# Patient Record
Sex: Male | Born: 1975 | Race: White | Hispanic: No | Marital: Single | Smoking: Former smoker
Health system: Southern US, Community
[De-identification: ages and names within clinical notes are randomized; demographics above are authoritative.]

## PROBLEM LIST (undated history)

## (undated) DIAGNOSIS — F1990 Other psychoactive substance use, unspecified, uncomplicated: Secondary | ICD-10-CM

---

## 2003-12-14 ENCOUNTER — Emergency Department (HOSPITAL_COMMUNITY): Admission: EM | Admit: 2003-12-14 | Discharge: 2003-12-14 | Payer: Self-pay | Admitting: Emergency Medicine

## 2005-05-07 ENCOUNTER — Emergency Department: Payer: Self-pay | Admitting: Emergency Medicine

## 2006-07-16 ENCOUNTER — Inpatient Hospital Stay (HOSPITAL_COMMUNITY): Admission: AD | Admit: 2006-07-16 | Discharge: 2006-07-18 | Payer: Self-pay | Admitting: Psychiatry

## 2006-07-16 ENCOUNTER — Ambulatory Visit: Payer: Self-pay | Admitting: Psychiatry

## 2007-02-03 ENCOUNTER — Emergency Department (HOSPITAL_COMMUNITY): Admission: EM | Admit: 2007-02-03 | Discharge: 2007-02-03 | Payer: Self-pay | Admitting: Emergency Medicine

## 2007-06-24 ENCOUNTER — Ambulatory Visit: Payer: Self-pay | Admitting: Psychiatry

## 2007-06-24 ENCOUNTER — Inpatient Hospital Stay (HOSPITAL_COMMUNITY): Admission: RE | Admit: 2007-06-24 | Discharge: 2007-06-27 | Payer: Self-pay | Admitting: Psychiatry

## 2007-06-24 ENCOUNTER — Emergency Department (HOSPITAL_COMMUNITY): Admission: EM | Admit: 2007-06-24 | Discharge: 2007-06-24 | Payer: Self-pay | Admitting: Emergency Medicine

## 2007-08-19 ENCOUNTER — Emergency Department (HOSPITAL_COMMUNITY): Admission: EM | Admit: 2007-08-19 | Discharge: 2007-08-19 | Payer: Self-pay | Admitting: Emergency Medicine

## 2008-06-23 IMAGING — CR DG CHEST 2V
2 series · 2 of 2 positions shown · non-contrast
Comparison: None.

CLINICAL DATA: Short of breath/fever/cough.
 CHEST - 2 VIEW:

[w chest pa]
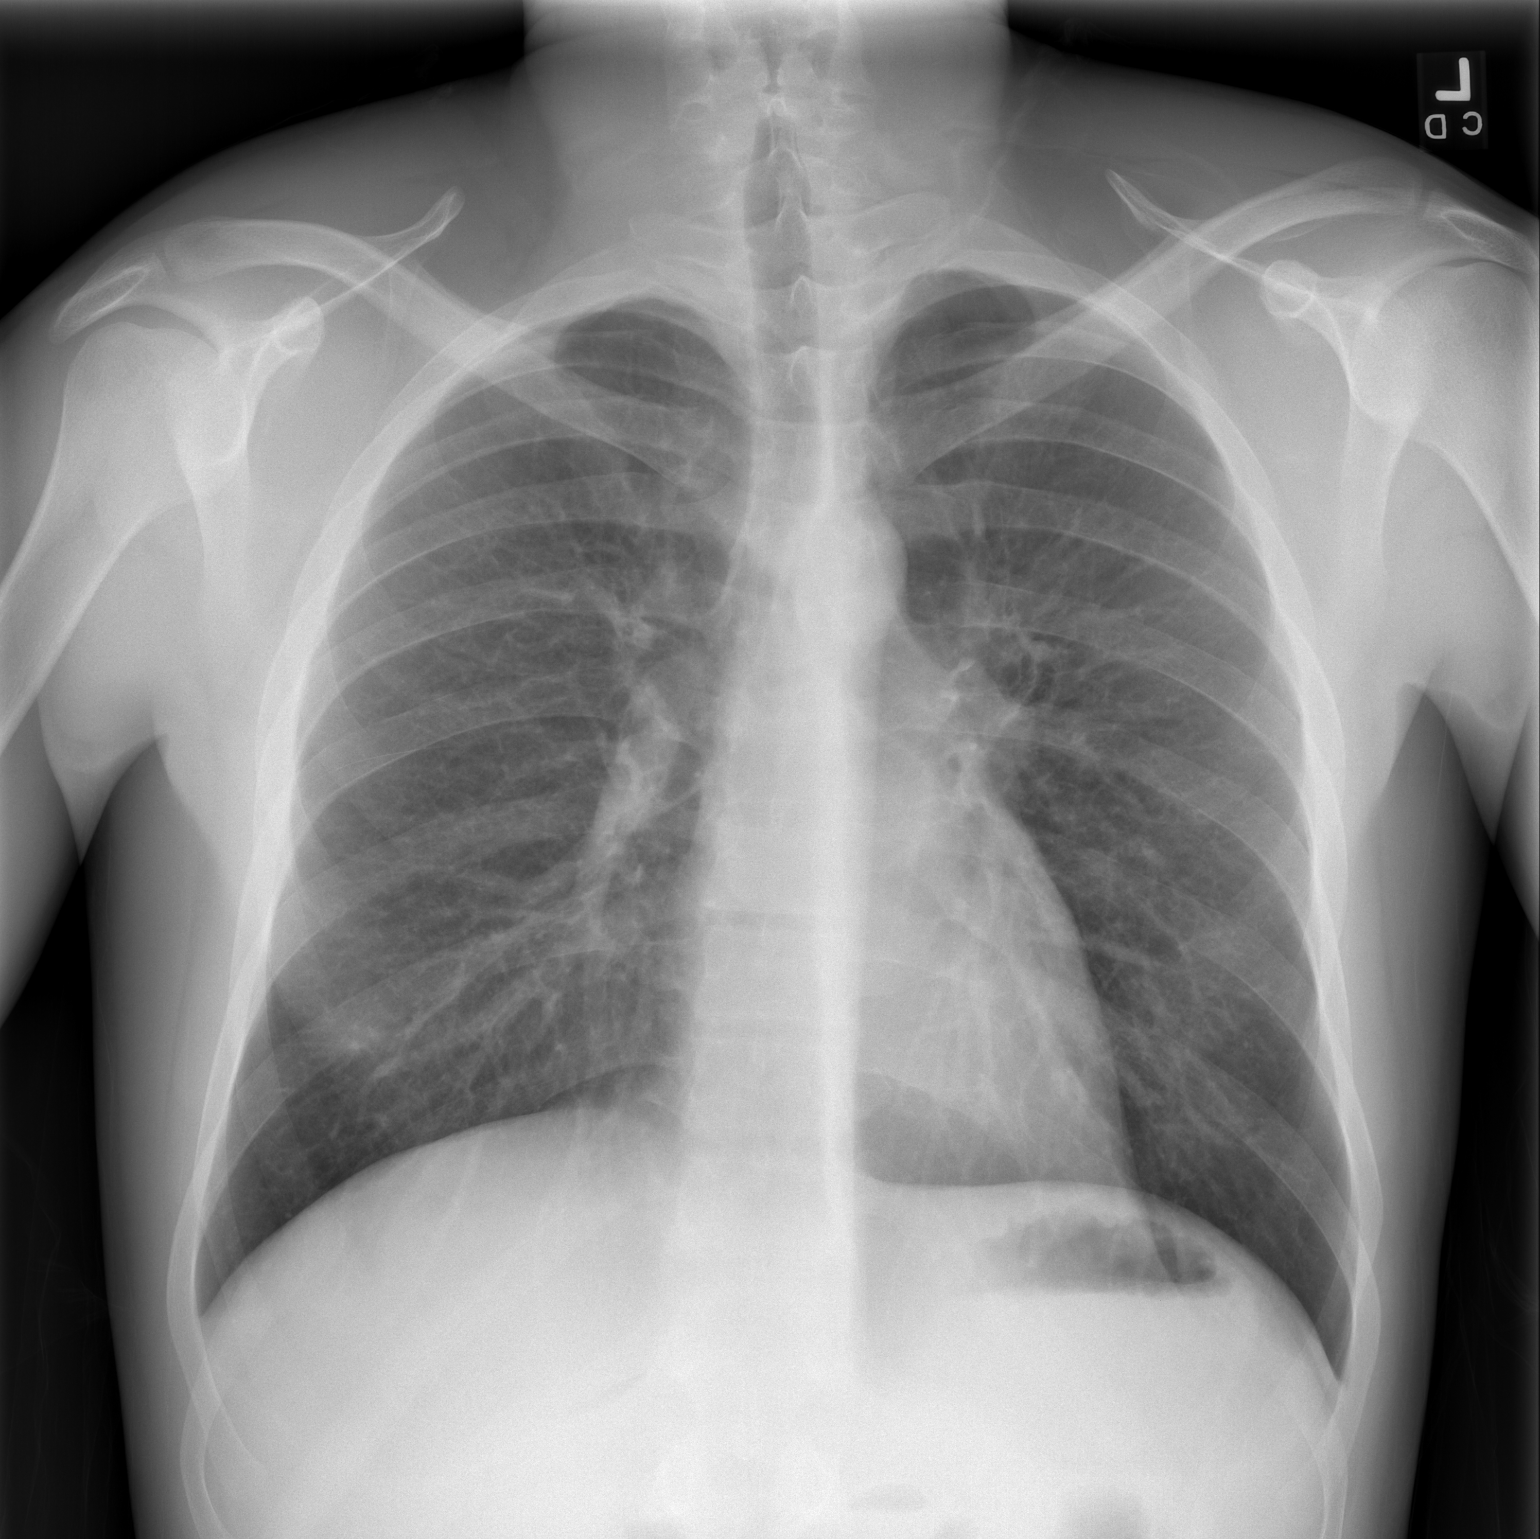

[w chest lat]
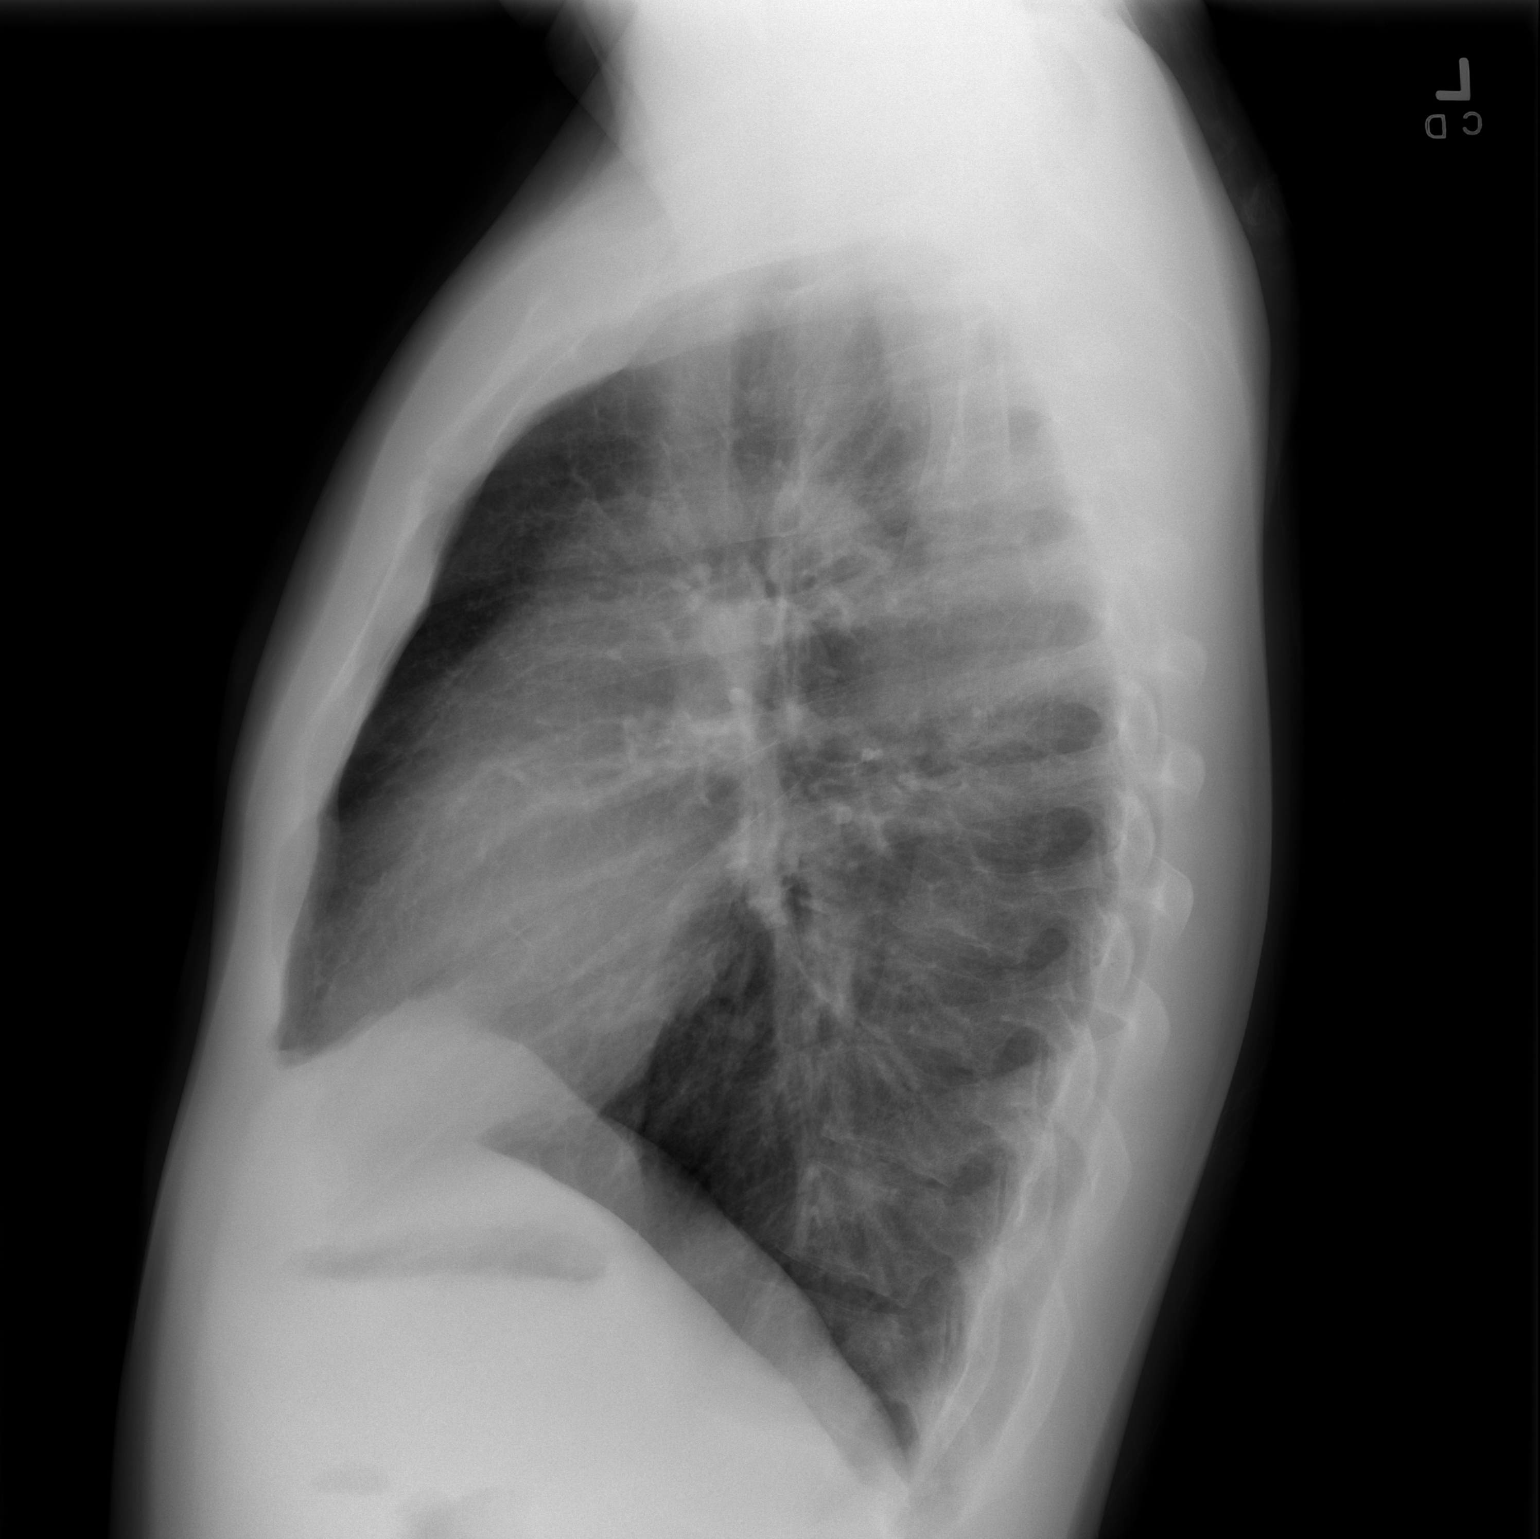

[2 of 2 positions shown; findings below may reference images not displayed]

FINDINGS: Heart and mediastinal contours normal.  Moderate peribronchial thickening.  No definite airspace disease but there is a possible subtle density in the posterior aspect of the right lower lobe.  Cannot exclude early right lower lobe pneumonia.  This is not a definite finding.  Careful clinical correlation needed.
IMPRESSION: 1.  Peribronchial thickening.
 2.  Cannot rule out subtle patchy airspace infiltrate in the right lower lobe ? see report.

## 2010-12-22 NOTE — Discharge Summary (Signed)
NAME:  Shaun Gilbert, Shaun Gilbert NO.:  [id number]   MEDICAL RECORD NO.:  [id number]          PATIENT TYPE:  IPS   LOCATION:  0503                          FACILITY:  [facility]   PHYSICIAN:  Geoffery Lyons, M.D.      DATE OF BIRTH:  1975-09-09   DATE OF ADMISSION:  06/24/2007  DATE OF DISCHARGE:  06/27/2007                               DISCHARGE SUMMARY   CHIEF COMPLAINT:  This was the second admission to [facility] for this 35 year old white male voluntarily admitted.  Presented  after an overdose of Darvocet after arguing with his spouse.  Also  requests detox from heroin using IV heroin for last 31-month and before  that sporadic history of opiate abuse involving other use of the use of  various forms.  The mother was an opiate abuser and he got started with  her 7 to 10 bags day before.  Endorsed alcohol 2 to 3 times a month,  endorsed cocaine abuse 2 times a week for 16 years.  Last use the day  before.   PAST PSYCHIATRIC HISTORY:  [facility] time at [facility] and was in  [facility] in 2000 after cutting his wrists.  History as  already stated persistent use of substances more so as of gradually  getting dependent on heroin.   MEDICAL HISTORY:  Noncontributory.   MEDICATIONS:  None prescribed.   PHYSICAL EXAMINATION:  Physical exam performed; failed to show any acute  findings.   LABORATORY WORK:  SGOT 83, SGPT 118, total bilirubin 0.8, TSH 0.304.  He  is positive for cocaine and opiates.  Blood chemistry within normal  limits.   MENTAL STATUS EXAMINATION:  Mental status evaluation reveals fully alert  male somewhat anxious, poor eye contact reclusive to room.  Poor  historian.  Speech normal rate, tempo and production.  Mood depressed,  anxious, affect anxious to constricted.  Thought processes logical,  coherent and relevant; endorses ruminations,  no active plan.  No  homicidal ideas, no delusions.  No hallucinations.  Cognition well-  preserved.   DIAGNOSES:  AXIS I:  Opiate and cocaine abuse rule out dependence.  Depressive disorder not otherwise specified.  AXIS II:  No diagnosis.  AXIS III:  Phlebitis.  AXIS IV:  Moderate.  AXIS V:  On admission 35 global assessment of functioning, GAF in the  last year 60.   COURSE IN THE HOSPITAL:  Was admitted.  He was started in individual and  group psychotherapy.  Was detoxified with clonidine.  He was given  trazodone for sleep.  As already stated, he is a 35 year old man,  endorsed present use of opiates heroin.  Endorsed active withdrawal  cramping really, bad malaise, muscle aches, in bed as not to be made to  get out of bed, acutely ill, not able to give very reliable history.  By  the 20th he was somewhat better.  Endorsed that he went to a halfway  house, that he was sent to when he was here before and that a guy that  he knew from before was trying to  hurt him and accusing him of something  he did not do. When the police came with a warrant he was asked to leave  the halfway house.  He said this set the stage according to him for  relapse for his relapse.  Also endorsed difficulty after breaking up  with the girlfriend's mother of his daughter and her moving away.  [place] Spaniel  that his daughter he thinks is with her grandparents and suspects that  his ex-girlfriend left to go to the [employer].  Endorsed he was trying to  get his life back together, wants detox.  He was wanting to go back into  halfway house or some other program as of lately trying to get a day  job, very upset because his not the way he was wanting his life to be.  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  By the 21st he was better, was able to secure  a placement at a halfway house.  He was encouraged, motivated to focus,  feeling physically better.  He was going to contact the daughter,  daughter's grandparents and tell them that he was really  working toward  getting his life back together and will ask them to be able to contact  with his daughter.   Upon discharge in full contact with reality. No suicidal ideas, no  hallucinations or delusions.   DISCHARGE DIAGNOSES:  AXIS I:  Depressive disorder not otherwise  specified.  Opiate, cocaine abuse.  AXIS II:  No diagnosis.  AXIS IV:  Moderate.  AXIS V:  Upon discharge 50, 55.   DISCHARGE PLAN:  Discharged on clonidine taper 0.1 1 in the morning 1 at  night for 2 days and then 1 in the morning for 2 days, then discontinue.  Trazodone 100 1/2 to 1 at bedtime as needed for sleep.      Geoffery Lyons, M.D.  Electronically Signed     IL/MEDQ  D:  08/01/2007  T:  08/02/2007  Job:  [id number]

## 2010-12-22 NOTE — Discharge Summary (Signed)
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  His  fiancee had broke up with him over his addiction.  They had a young  child together.  He had no prior inpatient treatment history, but had  kicked cocaine once before on his own for a period of 6 months.  Prior  to admission, he took an overdose of ibuprofen.  There was a family  history of suicide in his grandfather.  Please refer to the admission  note for further details pertaining to the symptoms, circumstances, and  history that led to his hospitalization.  He was given an initial Axis-I  diagnosis of cocaine dependence, depressive disorder NOS, and substance-  induced mood disorder.   MEDICAL AND LABORATORY:  The patient was medically and physically  assessed by the psychiatric nurse practitioner.  He was in good health  without any active or chronic medical problems.  There were no  significant medical issues.   HOSPITAL COURSE:  The patient was admitted to the adult inpatient  psychiatric service.  He presented as a well-nourished, well-developed  male who was fully oriented, and presented as tired, and moderately  depressed.  He was nontremulous.  He denied suicidal ideation.  There  were no signs or symptoms of psychosis, thought disorder, or delirium.  He verbalized a strong desire for help and to get into recovery and  maintain his sobriety.   Initially, a Librium detoxification protocol had been ordered, but  it  was deemed unnecessary and discontinued.  He participated in various  therapeutic groups and activities including groups oriented towards 12-  step recovery.  On the third hospital day, the patient indicated that he  felt quite ready for discharge.  He was not having any significant  withdrawal symptoms.  He agreed to the following aftercare plan.   AFTERCARE:  The patient was to leave our inpatient facility and go  directly to an [facility] at 5:00 p.m. on the day of discharge,  regarding a residential halfway house situation for sober man.   DISCHARGE MEDICATIONS:  None.   The patient was encouraged to attend AA meetings frequently and get an  AA sponsor.   DISCHARGE DIAGNOSES:  Axis I:  Alcohol dependence, cocaine dependence,  early remission.  Axis II:  Deferred.  Axis III:  No acute or chronic illnesses.  Axis IV:  Stressors severe.  Axis V: Global assessment of functioning on discharge 65.      Anselm Jungling, MD  Electronically Signed     SPB/MEDQ  D:  08/15/2006  T:  08/16/2006  Job:  [id number] 

## 2010-12-22 NOTE — H&P (Signed)
NAME:  Shaun Gilbert, Shaun Gilbert NO.:  [id number]   MEDICAL RECORD NO.:  [id number]          PATIENT TYPE:  IPS   LOCATION:  0300                          FACILITY:  [facility]   PHYSICIAN:  Anselm Jungling, MD  DATE OF BIRTH:  11/30/1975   DATE OF ADMISSION:  07/16/2006  DATE OF DISCHARGE:                       PSYCHIATRIC ADMISSION ASSESSMENT   IDENTIFICATION:  This is a 35 year old white male who is single.  This  is a voluntary admission.   HISTORY OF PRESENT ILLNESS:  This patient overdosed on ibuprofen, taking  several tablets, while under the influence of about $140 worth of  cocaine which he had snorted and smoked in the 24 hours prior to  admission.  He also had drunk some alcohol and had an alcohol level of  about 35.  He is the new father of a 84-week-old infant daughter and had  been housed with his girlfriend at the girlfriend's mother's home.  They  got into an argument, and he left the home.  Subsequently, his  girlfriend was also kicked out of the home.  He attempted to obtain  housing in a motel for her, but they were running low on funds.  The  patient reports he relapsed then on cocaine triggered by his feeling of  failure as a father and Willies Laviolette to his new family.  The patient reports  that prior to that he had been totally abstinent from drugs and alcohol  for about six months, has a history of using of cocaine since age 64  which is his primary drug of choice.  He used a benzodiazepine tablet,  one, within the last 48 hours to help himself calm down, having a lot of  guilt about his failure and his relapse.  He clearly had suicidal  thoughts at the time he overdosed.  He denies any suicidal thoughts  today, continues to feel guilty and worthless.   PAST PSYCHIATRIC HISTORY:  This is the patient's first admission to  [facility].  He has no current outpatient  psychiatric care.  He does have a history of prior admission to [facility] at age 60 for drug abuse and mood problems and was treated at  that time with Prozac 20 mg daily which he took for about a year or so,  and he felt that it helped him and subsequently stopped.  He also has a  history of an admission to [facility] in 2000 after a suicide  attempt by cutting his wrist after the death of his best friend.  He is  currently on no medications.  He reports a history of substance abuse  since age 67, primarily cocaine, but also has a distant history of IV  drug use of heroin, none in the last 5 years.  Rare use of alcohol.  Denies addiction to currently to any substances other than cocaine.   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  He  is currently homeless  as noted above, and he is jobless.  No current legal charges.   FAMILY HISTORY:  The patient's mother was addicted to opiates and died  of an overdose approximately six years ago.  He has no siblings.  Father's history is unknown.   ALCOHOL AND DRUG HISTORY:  The patient's primary drug of choice is  cocaine abuse.  The patient endorses that he has maintained abstinence  with the help of a busy work schedule and staying goal-directed, has  never used medicines to help cut cravings or maintain abstinence.   MEDICAL HISTORY:  The patient has no primary care Melea Prezioso.  Medical  problems are hypokalemia which was repleted in the emergency room with  20 mEq of a potassium and status post NSAID overdose, and he was  medically cleared by poison control.  No other medical problems. Past  medical history is remarkable for a history of childhood asthma, has not  had an asthma attack in more than 20 years.  He denies any history of  seizures, blackouts or memory loss.  Denies any liver disease, kidney  disease, myocardial infarct or history of chest  pain.  No prior  surgeries or hospitalizations other than psychiatric hospitalizations  noted above.   MEDICATIONS:  Are none.   DRUG ALLERGIES:  PENICILLIN WHICH CAUSES A RASH.   POSITIVE PHYSICAL FINDINGS:  This is a well-nourished, well-developed  male who appears to be his stated age of 37, slightly flushed in  appearance with an anxious affect.  VITAL SIGNS:  On presentation, he is afebrile, pulse 113, respirations  18, blood pressure 142/85.  Physical exam was done in the emergency room and is noted there and is  entirely unremarkable.  NEUROLOGICAL EXAM:  Is nonfocal.   DIAGNOSTIC STUDIES:  Reveal urine drug screen positive for cocaine and  benzodiazepines.  Alcohol level of 35.  His CBC:  WBC 11.8, hemoglobin  15.2, hematocrit 44.2 and platelets 267,000.  Chemistry:  Sodium 140,  potassium 3.8, chloride 102, carbon dioxide 29, BUN 12, creatinine 1.1  and random glucose 96, and these labs are from December 12.  TSH is  0.595.  His acetaminophen medicine level was less than 10 and salicylate  level was less than 4.  Five feet 9 inches tall, 176 pounds.   MENTAL STATUS EXAM:  Fully alert male, pleasant, cooperative, polite.  He is alert with quite a bit of tearfulness to his affect, especially  when talks about how guilty he feels.  He feels helpless to be able to  support is family.  Speech is normal in pace, tone, and amount,  articulate, fluent.  Mood is depressed, considerable guilt and feels  worthless, very guilty that he has relapsed and feeling guilty about  failure to provide as a father and spouse.  Thought process is logical  and coherent.  Denying any suicidal thoughts today but clearly had those  yesterday.  No perceptual changes.  Cognitively, he is intact and  oriented x3.  Insight is adequate.  Impulse control and judgment within normal limits.  No withdrawal symptoms.  CIWA is essentially zero.  His  pulse has normalized today after our having received 25 mg  of Librium  last night which made him very drowsy.  No homicidal thought.   AXIS I:  Substance-induced mood disorder.  Cocaine dependence.  AXIS II:  Deferred.  AXIS III:  No diagnosis.  AXIS IV:  Severe, problems with homelessness and being a new father  with  15-week-old infant.  AXIS V:  Current 30, past year 73.   PLAN:  Is to voluntarily admit the patient with every 15-minute checks  in place for evaluation of his suicide attempt and to alleviate his  suicidal thoughts.  We are going to stop the Librium protocol since it  is making him oversedated.  He has only used benzodiazepines once and  denies any dependence on those.  Rare use of alcohol.  We will give him  trazodone 25-50 mg h.s. p.r.n., and we will obtain a complete metabolic  panel to evaluate his hepatic enzymes and to recheck his renal function  after the ibuprofen overdose.  Estimated length of stay is 5 days.      Margaret A. Lorin Picket, N.P.      Anselm Jungling, MD  Electronically Signed   MAS/MEDQ  D:  07/17/2006  T:  07/17/2006  Job:  [phone]

## 2011-05-15 LAB — BASIC METABOLIC PANEL
CO2: 27
Chloride: 101
GFR calc non Af Amer: >60
Sodium: 135

## 2011-05-15 LAB — ACETAMINOPHEN LEVEL: Acetaminophen (Tylenol), Serum: <10 — ABNORMAL LOW

## 2011-05-15 LAB — URINALYSIS, ROUTINE W REFLEX MICROSCOPIC
Bilirubin Urine: NEGATIVE
Glucose, UA: NEGATIVE
Ketones, ur: NEGATIVE
Nitrite: NEGATIVE
Protein, ur: NEGATIVE
Specific Gravity, Urine: 1.018
Urobilinogen, UA: 0.2

## 2011-05-15 LAB — HEPATIC FUNCTION PANEL
AST: 83 — ABNORMAL HIGH
Alkaline Phosphatase: 122 — ABNORMAL HIGH
Bilirubin, Direct: 0.2
Indirect Bilirubin: 0.6
Total Bilirubin: 0.8

## 2011-05-15 LAB — CBC
HCT: 41.3
MCHC: 35.4
MCV: 90.9
Platelets: 263

## 2011-05-15 LAB — DIFFERENTIAL
Basophils Relative: 0
Eosinophils Absolute: 0.2
Lymphocytes Relative: 13
Monocytes Relative: 9

## 2011-05-15 LAB — RAPID URINE DRUG SCREEN, HOSP PERFORMED
Benzodiazepines: NOT DETECTED
Cocaine: POSITIVE — AB
Tetrahydrocannabinol: NOT DETECTED

## 2011-05-15 LAB — TSH: TSH: 0.304 — ABNORMAL LOW

## 2011-05-23 LAB — COMPREHENSIVE METABOLIC PANEL
AST: 20
Albumin: 3.3 — ABNORMAL LOW
Alkaline Phosphatase: 98
BUN: 10
Creatinine, Ser: 0.96
GFR calc Af Amer: >60
Potassium: 3.6

## 2011-05-23 LAB — CBC
RBC: 4.96
RDW: 14
WBC: 7.5

## 2011-05-23 LAB — RAPID URINE DRUG SCREEN, HOSP PERFORMED
Benzodiazepines: NOT DETECTED
Opiates: POSITIVE — AB

## 2011-05-23 LAB — DIFFERENTIAL
Basophils Relative: 1
Eosinophils Relative: 4
Lymphocytes Relative: 38
Lymphs Abs: 2.9
Monocytes Absolute: 0.9 — ABNORMAL HIGH
Monocytes Relative: 12 — ABNORMAL HIGH

## 2011-05-23 LAB — ACETAMINOPHEN LEVEL: Acetaminophen (Tylenol), Serum: <10 — ABNORMAL LOW

## 2011-05-23 LAB — SALICYLATE LEVEL: Salicylate Lvl: <4

## 2011-05-23 LAB — PROTIME-INR: INR: 1

## 2016-12-13 ENCOUNTER — Encounter (HOSPITAL_COMMUNITY): Payer: Self-pay | Admitting: Emergency Medicine

## 2016-12-13 ENCOUNTER — Emergency Department (HOSPITAL_COMMUNITY)
Admission: EM | Admit: 2016-12-13 | Discharge: 2016-12-13 | Disposition: A | Discharge status: Home / Self Care | Payer: Self-pay | Attending: Emergency Medicine | Admitting: Emergency Medicine

## 2016-12-13 DIAGNOSIS — F1523 Other stimulant dependence with withdrawal: Secondary | ICD-10-CM | POA: Insufficient documentation

## 2016-12-13 DIAGNOSIS — Z87891 Personal history of nicotine dependence: Secondary | ICD-10-CM | POA: Insufficient documentation

## 2016-12-13 DIAGNOSIS — F1410 Cocaine abuse, uncomplicated: Secondary | ICD-10-CM | POA: Insufficient documentation

## 2016-12-13 DIAGNOSIS — Z5181 Encounter for therapeutic drug level monitoring: Secondary | ICD-10-CM | POA: Insufficient documentation

## 2016-12-13 DIAGNOSIS — F1910 Other psychoactive substance abuse, uncomplicated: Secondary | ICD-10-CM

## 2016-12-13 HISTORY — DX: Other psychoactive substance use, unspecified, uncomplicated: F19.90

## 2016-12-13 LAB — RAPID URINE DRUG SCREEN, HOSP PERFORMED
Amphetamines: POSITIVE — AB
BARBITURATES: NOT DETECTED
Benzodiazepines: NOT DETECTED
COCAINE: POSITIVE — AB
Opiates: NOT DETECTED
TETRAHYDROCANNABINOL: NOT DETECTED

## 2016-12-13 LAB — COMPREHENSIVE METABOLIC PANEL
ALBUMIN: 4.5 g/dL (ref 3.5–5.0)
ALK PHOS: 79 U/L (ref 38–126)
ALT: 40 U/L (ref 17–63)
ANION GAP: 10 (ref 5–15)
AST: 47 U/L — AB (ref 15–41)
BUN: 25 mg/dL — AB (ref 6–20)
CALCIUM: 9.3 mg/dL (ref 8.9–10.3)
CO2: 27 mmol/L (ref 22–32)
Chloride: 100 mmol/L — ABNORMAL LOW (ref 101–111)
Creatinine, Ser: 1.35 mg/dL — ABNORMAL HIGH (ref 0.61–1.24)
GFR calc Af Amer: >60 mL/min (ref >60)
GFR calc non Af Amer: >60 mL/min (ref >60)
GLUCOSE: 109 mg/dL — AB (ref 65–99)
Potassium: 3.6 mmol/L (ref 3.5–5.1)
SODIUM: 137 mmol/L (ref 135–145)
Total Bilirubin: 1 mg/dL (ref 0.3–1.2)
Total Protein: 8.2 g/dL — ABNORMAL HIGH (ref 6.5–8.1)

## 2016-12-13 LAB — CBC WITH DIFFERENTIAL/PLATELET
BASOS ABS: 0.1 10*3/uL (ref 0.0–0.1)
BASOS PCT: 0 %
EOS PCT: 2 %
Eosinophils Absolute: 0.3 10*3/uL (ref 0.0–0.7)
HEMATOCRIT: 42.2 % (ref 39.0–52.0)
Hemoglobin: 15.2 g/dL (ref 13.0–17.0)
Lymphocytes Relative: 15 %
Lymphs Abs: 2.1 10*3/uL (ref 0.7–4.0)
MCH: 31.3 pg (ref 26.0–34.0)
MCHC: 36 g/dL (ref 30.0–36.0)
MCV: 87 fL (ref 78.0–100.0)
MONO ABS: 1.6 10*3/uL — AB (ref 0.1–1.0)
Monocytes Relative: 11 %
NEUTROS ABS: 9.9 10*3/uL — AB (ref 1.7–7.7)
Neutrophils Relative %: 72 %
PLATELETS: 246 10*3/uL (ref 150–400)
RBC: 4.85 MIL/uL (ref 4.22–5.81)
RDW: 12.6 % (ref 11.5–15.5)
WBC: 13.9 10*3/uL — AB (ref 4.0–10.5)

## 2016-12-13 LAB — ETHANOL: Alcohol, Ethyl (B): <5 mg/dL (ref <5)

## 2016-12-13 MED ORDER — ACETAMINOPHEN 325 MG PO TABS: 650.0000 mg | ORAL_TABLET | ORAL | Status: DC | PRN

## 2016-12-13 MED ORDER — MAGIC MOUTHWASH
5.0000 mL | Freq: Once | ORAL | Status: AC
  Administered 2016-12-13: 5 mL via ORAL
  Filled 2016-12-13: qty 5

## 2016-12-13 MED ORDER — LORAZEPAM 1 MG PO TABS: 1.0000 mg | ORAL_TABLET | Freq: Three times a day (TID) | ORAL | Status: DC | PRN

## 2016-12-13 MED ORDER — IBUPROFEN 200 MG PO TABS: 600.0000 mg | ORAL_TABLET | Freq: Three times a day (TID) | ORAL | Status: DC | PRN

## 2016-12-13 NOTE — BH Assessment (Signed)
TTS ordered for patient by Dr. Rubin PayorPickering. Writer reviewed patient's complaint related to today's visit. [facility]. No SI, HI, and AVH's, per ED staff notes. Writer discussed patient's needs with  Dr. Rubin PayorPickering and he agreed to cancel the TTS consult. Writer provided patient with a list of substance abuse referrals ([facility], [facility], [facility]). Patient was agreeable to following up with the referrals provided.

## 2016-12-13 NOTE — ED Provider Notes (Signed)
[facility]-EMERGENCY DEPT Provider Note   CSN: [id number] Arrival date & time: 12/13/16  0827     History   Chief Complaint Chief Complaint  Patient presents with  . Drug Problem    HPI Shaun Gilbert is a 41 y.o. male.  HPI Patient presents requesting detox for his drug abuse. Has been using methamphetamine for the last 2 weeks. Had been sober for around 2 years. Has been using heavily. States he has a lot last night and is still feeling it. Also useKratom.  denies suicidal or homicidal thoughts. Denies chest pain. Denies trouble breathing. He has been injecting methamphetamine. No abscess. No fevers. He thinks he could use some inpatient treatment for a month. States that when he was in prison was tested for HIV and was negative but was also tested for hepatitis and said there was some cells with been cleared up and he was told his blood type cleared it for him.   Past Medical History:  Diagnosis Date  . Drug use     There are no active problems to display for this patient.   History reviewed. No pertinent surgical history.     Home Medications    Prior to Admission medications   Not on File    Family History History reviewed. No pertinent family history.  Social History Social History  Substance Use Topics  . Smoking status: Former Games developer  . Smokeless tobacco: Former Neurosurgeon  . Alcohol use Yes     Allergies   Penicillins   Review of Systems Review of Systems  Constitutional: Negative for appetite change.  HENT: Negative for congestion.   Respiratory: Negative for shortness of breath.   Cardiovascular: Negative for chest pain.  Gastrointestinal: Negative for abdominal distention.  Genitourinary: Negative for flank pain.  Musculoskeletal: Negative for back pain.  Neurological: Negative for syncope.  Psychiatric/Behavioral: Negative for hallucinations. The patient is nervous/anxious.      Physical Exam Updated Vital Signs BP (!) 132/92 (BP Location:  Left Arm)   Pulse (!) 120   Temp 98.8 F (37.1 C) (Oral)   Resp (!) 22   Ht 5\' 11"  (1.803 m)   Wt 200 lb (90.7 kg)   SpO2 93%   BMI 27.89 kg/m   Physical Exam  Constitutional: He appears well-developed.  HENT:  Head: Atraumatic.  Eyes: EOM are normal.  Neck: Neck supple.  Cardiovascular:  Tachycardia  Abdominal: Soft. There is no tenderness.  Musculoskeletal: He exhibits no edema.  Neurological: He is alert.  Skin:  Injection sites of forearms and antecubital area. No abscess.  Psychiatric:  Patient is mildly pressured.     ED Treatments / Results  Labs (all labs ordered are listed, but only abnormal results are displayed) Labs Reviewed  COMPREHENSIVE METABOLIC PANEL - Abnormal; Notable for the following:       Result Value   Chloride 100 (*)    Glucose, Bld 109 (*)    BUN 25 (*)    Creatinine, Ser 1.35 (*)    Total Protein 8.2 (*)    AST 47 (*)    All other components within normal limits  RAPID URINE DRUG SCREEN, HOSP PERFORMED - Abnormal; Notable for the following:    Cocaine POSITIVE (*)    Amphetamines POSITIVE (*)    All other components within normal limits  CBC WITH DIFFERENTIAL/PLATELET - Abnormal; Notable for the following:    WBC 13.9 (*)    Neutro Abs 9.9 (*)    Monocytes Absolute 1.6 (*)  All other components within normal limits  ETHANOL  HIV ANTIBODY (ROUTINE TESTING)    EKG  EKG Interpretation None       Radiology No results found.  Procedures Procedures (including critical care time)  Medications Ordered in ED Medications  magic mouthwash (5 mLs Oral Given 12/13/16 1204)     Initial Impression / Assessment and Plan / ED Course  I have reviewed the triage vital signs and the nursing notes.  Pertinent labs & imaging results that were available during my care of the patient were reviewed by me and considered in my medical decision making (see chart for details).     Patient requesting detox off of methamphetamine. Labs  reassuring. HIV sent but pending. Discussed with TTS who states they did not do placement for this anymore. Given resources and patient was discharged.  Final Clinical Impressions(s) / ED Diagnoses   Final diagnoses:  Polysubstance abuse    New Prescriptions There are no discharge medications for this patient.    Benjiman CorePickering, Brad Mcgaughy, MD 12/13/16 [phone]

## 2016-12-13 NOTE — ED Notes (Addendum)
Patient is a&ox4, ambulatory.pt's mother is at bedside. Resources provided for continued follow up. Discharge instructions reviewed.  Questions concerns denied.

## 2016-12-13 NOTE — Discharge Instructions (Signed)
Follow-up with the resources given. Follow-up also with the primary care doctor for further management.

## 2016-12-13 NOTE — ED Triage Notes (Signed)
Pt states he has been abusing meth and cratom x 2 weeks after being sober for 2 years. Pt states he would like treatment for drug abuse. Denies SI / HI.

## 2016-12-13 NOTE — ED Notes (Signed)
Pt made aware of urine sample 

## 2016-12-14 LAB — HIV ANTIBODY (ROUTINE TESTING W REFLEX): HIV Screen 4th Generation wRfx: NONREACTIVE

## 2019-03-19 ENCOUNTER — Other Ambulatory Visit: Payer: Self-pay

## 2019-03-19 DIAGNOSIS — Z20822 Contact with and (suspected) exposure to covid-19: Secondary | ICD-10-CM

## 2019-03-21 LAB — NOVEL CORONAVIRUS, NAA: SARS-CoV-2, NAA: NOT DETECTED
